# Patient Record
Sex: Male | Born: 1975 | Race: White | Hispanic: No | Marital: Married | Smoking: Never smoker
Health system: Southern US, Community
[De-identification: ages and names within clinical notes are randomized; demographics above are authoritative.]

## PROBLEM LIST (undated history)

## (undated) HISTORY — PX: ELBOW SURGERY: SHX618

---

## 2012-04-03 ENCOUNTER — Emergency Department (HOSPITAL_COMMUNITY)
Admission: EM | Admit: 2012-04-03 | Discharge: 2012-04-03 | Disposition: A | Discharge status: Home / Self Care | Payer: BC Managed Care – PPO | Attending: Emergency Medicine | Admitting: Emergency Medicine

## 2012-04-03 ENCOUNTER — Encounter (HOSPITAL_COMMUNITY): Payer: Self-pay | Admitting: *Deleted

## 2012-04-03 ENCOUNTER — Emergency Department (HOSPITAL_COMMUNITY): Payer: BC Managed Care – PPO

## 2012-04-03 DIAGNOSIS — N433 Hydrocele, unspecified: Secondary | ICD-10-CM | POA: Insufficient documentation

## 2012-04-03 DIAGNOSIS — N453 Epididymo-orchitis: Secondary | ICD-10-CM | POA: Insufficient documentation

## 2012-04-03 LAB — URINALYSIS, ROUTINE W REFLEX MICROSCOPIC
Hgb urine dipstick: NEGATIVE
Nitrite: NEGATIVE
Specific Gravity, Urine: 1.017 (ref 1.005–1.030)
Urobilinogen, UA: 0.2 mg/dL (ref 0.0–1.0)

## 2012-04-03 LAB — COMPREHENSIVE METABOLIC PANEL
ALT: 32 U/L (ref 0–53)
Alkaline Phosphatase: 81 U/L (ref 39–117)
CO2: 27 mEq/L (ref 19–32)
GFR calc Af Amer: >90 mL/min (ref >90)
GFR calc non Af Amer: >90 mL/min (ref >90)
Glucose, Bld: 105 mg/dL — ABNORMAL HIGH (ref 70–99)
Potassium: 4.3 mEq/L (ref 3.5–5.1)
Sodium: 139 mEq/L (ref 135–145)

## 2012-04-03 LAB — CBC WITH DIFFERENTIAL/PLATELET
Lymphocytes Relative: 23 % (ref 12–46)
Lymphs Abs: 3.3 10*3/uL (ref 0.7–4.0)
MCV: 88 fL (ref 78.0–100.0)
Neutro Abs: 9.6 10*3/uL — ABNORMAL HIGH (ref 1.7–7.7)
Neutrophils Relative %: 68 % (ref 43–77)
Platelets: 197 10*3/uL (ref 150–400)
RBC: 5.09 MIL/uL (ref 4.22–5.81)
WBC: 14.1 10*3/uL — ABNORMAL HIGH (ref 4.0–10.5)

## 2012-04-03 MED ORDER — LEVOFLOXACIN 750 MG PO TABS
750.0000 mg | ORAL_TABLET | Freq: Once | ORAL | Status: AC
  Administered 2012-04-03: 750 mg via ORAL
  Filled 2012-04-03: qty 1

## 2012-04-03 MED ORDER — OXYCODONE-ACETAMINOPHEN 5-325 MG PO TABS: 1.0000 | ORAL_TABLET | Freq: Four times a day (QID) | ORAL | Status: AC | PRN

## 2012-04-03 MED ORDER — LEVOFLOXACIN 250 MG PO TABS: 750.0000 mg | ORAL_TABLET | Freq: Every day | ORAL | Status: AC

## 2012-04-03 MED ORDER — IBUPROFEN 800 MG PO TABS: 800.0000 mg | ORAL_TABLET | Freq: Three times a day (TID) | ORAL | Status: AC

## 2012-04-03 NOTE — ED Notes (Signed)
Patient presents stating his testicle is swollen and does not remember any injury.  Denies blood in his urine, +fever.  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  Went to the gym Thursday evening and did some "boxing"  He wore a cup and does not remember getting hit.  Then he went to the gym Friday evening and just worked out.  Did not lift anything heavy.

## 2012-04-03 NOTE — ED Provider Notes (Signed)
History     CSN: [id number]  Arrival date & time 04/03/12  1947   First MD Initiated Contact with Patient 04/03/12 2042      Chief Complaint  Patient presents with  . swollen testicle     (Consider location/radiation/quality/duration/timing/severity/associated sxs/prior treatment) HPI Comments: Chad Larson presents with his wife for evaluation of left scrotal and testicular pain and swelling.  He reports that he felt fine yesterday and had no trauma to the testicle.  He awoke this morning to a mild or vague discomfort that has slowly increased in intensity and is now painful.  He has also noticed increased swelling.  He denies dysuria, high risk sexual behaviors, penile discharges, weakened urine flow, hematuria, pyuria, and abdominal pain.  He reports he has felt warm as if he were developing a fever.  Patient is a 36 y.o. male presenting with testicular pain. The history is provided by the patient. No language interpreter was used.  Testicle Pain This is a new problem. The current episode started 6 to 12 hours ago. The problem occurs constantly. The problem has been gradually worsening. Pertinent negatives include no chest pain, no abdominal pain, no headaches and no shortness of breath. The symptoms are aggravated by walking, bending, twisting and standing. The symptoms are relieved by rest (elevating the scrotum).    History reviewed. No pertinent past medical history.  History reviewed. No pertinent past surgical history.  No family history on file.  History  Substance Use Topics  . Smoking status: Never Smoker   . Smokeless tobacco: Not on file  . Alcohol Use: Yes      Review of Systems  Constitutional: Positive for fever. Negative for chills, diaphoresis, appetite change and fatigue.  HENT: Negative.   Eyes: Negative.   Respiratory: Negative.  Negative for shortness of breath.   Cardiovascular: Negative.  Negative for chest pain.  Gastrointestinal: Negative for  nausea, vomiting, abdominal pain, diarrhea, constipation and blood in stool.  Genitourinary: Positive for scrotal swelling and testicular pain. Negative for dysuria, urgency, frequency, hematuria, flank pain, decreased urine volume, discharge, penile swelling, difficulty urinating, genital sores and penile pain.  Musculoskeletal: Negative.   Skin: Negative.   Neurological: Negative for headaches.  Hematological: Negative.   Psychiatric/Behavioral: Negative.     Allergies  Review of patient's allergies indicates no known allergies.  Home Medications  No current outpatient prescriptions on file.  BP 134/82  Pulse 101  Temp 100.4 F (38 C) (Oral)  Resp 18  SpO2 100%  Physical Exam  Nursing note and vitals reviewed. Constitutional: He is oriented to person, place, and time. He appears well-developed and well-nourished. No distress.  HENT:  Head: Normocephalic and atraumatic.  Right Ear: External ear normal.  Left Ear: External ear normal.  Nose: Nose normal.  Mouth/Throat: Oropharynx is clear and moist. No oropharyngeal exudate.  Eyes: Conjunctivae normal and EOM are normal. Pupils are equal, round, and reactive to light. Right eye exhibits no discharge. Left eye exhibits no discharge. No scleral icterus.  Neck: Normal range of motion. Neck supple. No JVD present. No tracheal deviation present. No thyromegaly present.  Cardiovascular: Normal rate, regular rhythm, normal heart sounds and intact distal pulses.  Exam reveals no gallop and no friction rub.   No murmur heard. Pulmonary/Chest: Effort normal and breath sounds normal. No stridor. No respiratory distress. He has no wheezes. He has no rales. He exhibits no tenderness.  Abdominal: Soft. Bowel sounds are normal. He exhibits no distension and no mass.  There is no tenderness. There is no rebound and no guarding. Hernia confirmed negative in the right inguinal area and confirmed negative in the left inguinal area.  Genitourinary:  Penis normal. Right testis shows no mass, no swelling and no tenderness. Right testis is descended. Cremasteric reflex is not absent on the right side. Left testis shows mass, swelling and tenderness. Left testis is descended. Cremasteric reflex is absent on the left side. Circumcised. No phimosis, paraphimosis, hypospadias, penile erythema or penile tenderness. No discharge found.  Musculoskeletal: Normal range of motion. He exhibits no edema and no tenderness.  Lymphadenopathy:    He has no cervical adenopathy.       Right: No inguinal adenopathy present.       Left: No inguinal adenopathy present.  Neurological: He is alert and oriented to person, place, and time. No cranial nerve deficit.  Skin: Skin is warm and dry. No rash noted. He is not diaphoretic. No erythema. No pallor.  Psychiatric: He has a normal mood and affect. His behavior is normal.    ED Course  Procedures (including critical care time)  Labs Reviewed  CBC WITH DIFFERENTIAL - Abnormal; Notable for the following:    WBC 14.1 (*)     Neutro Abs 9.6 (*)     Monocytes Absolute 1.1 (*)     All other components within normal limits  COMPREHENSIVE METABOLIC PANEL - Abnormal; Notable for the following:    Glucose, Bld 105 (*)     AST 39 (*)     All other components within normal limits  URINALYSIS, ROUTINE W REFLEX MICROSCOPIC   No results found.   No diagnosis found.    MDM  Pt presents for evaluation of left testicular pain and swelling.  He has a significantly swollen left testicle with pain to palpation along the epididymus and what feels like a hydrocele.  No hernia appreciated on exam.  U/A and metabolic panel are nl, but there is a leukocytosis on CBC.  Pt refuses any pain medication.  U/S has been ordered.  Will review results as available.  2300.  Pt stable, NAD.  U/s demonstrates a left epididymitis/orchitis with a small hydrocele.  No masses or evidence of torsion noted.  Pt again denies high risk sexual  behaviors.  Plan treat with a 10 day course of levaquin.  Will provide follow-up information for a local urologist.  Discussed indications for immediate return to the emergency department.      Tobin Chad, MD 04/03/12 [phone]

## 2012-04-03 NOTE — ED Notes (Signed)
The pt has had a swelling in his lt testicle all day today with an elevated temp.  No  Urinary difficulty no bloody urine.  No previous history

## 2013-08-29 IMAGING — US US ART/VEN ABD/PELV/SCROTUM DOPPLER LTD
1 series · 13 of 25 positions shown · non-contrast
Comparison: None

CLINICAL DATA: 35-year-old male with left testicular pain and
swelling.

SCROTAL ULTRASOUND
DOPPLER ULTRASOUND OF THE TESTICLES
TECHNIQUE: Complete ultrasound examination of the testicles,
epididymis, and other scrotal structures was performed.  Color and
spectral Doppler ultrasound were also utilized to evaluate blood
flow to the testicles.

[Series 1: us art/ven abd/pelv/scrotum doppler ltd · 0.08mm/px · 13 of 55 slices shown]
[im 1/55]
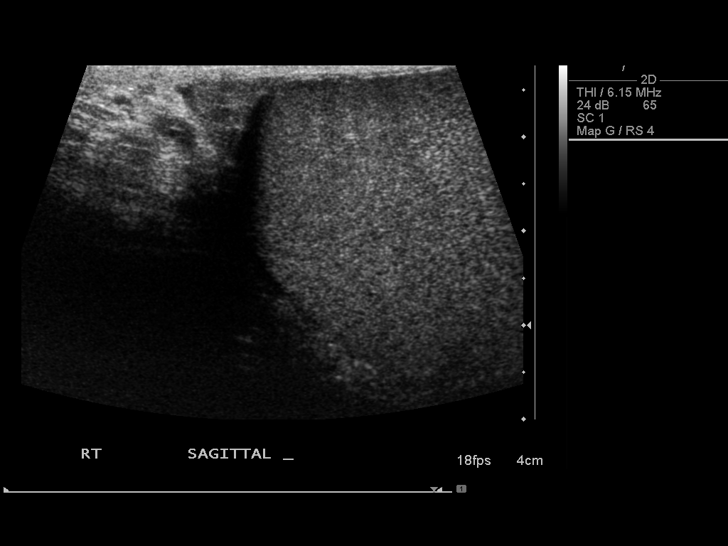
[im 5/55]
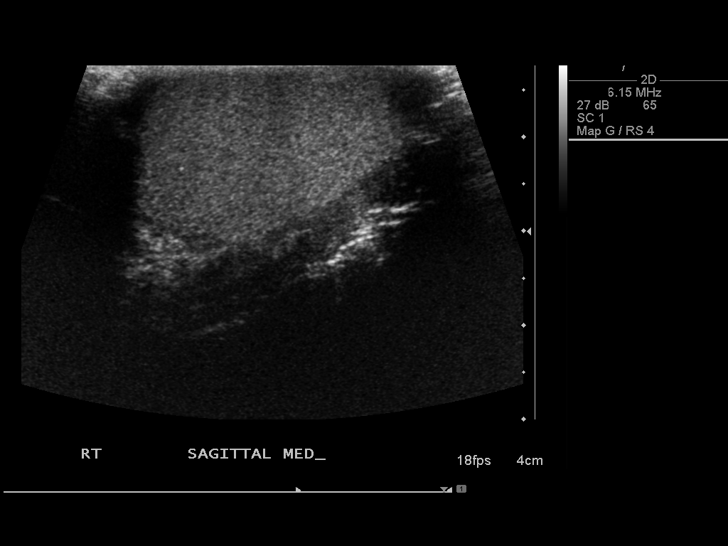
[im 10/55]
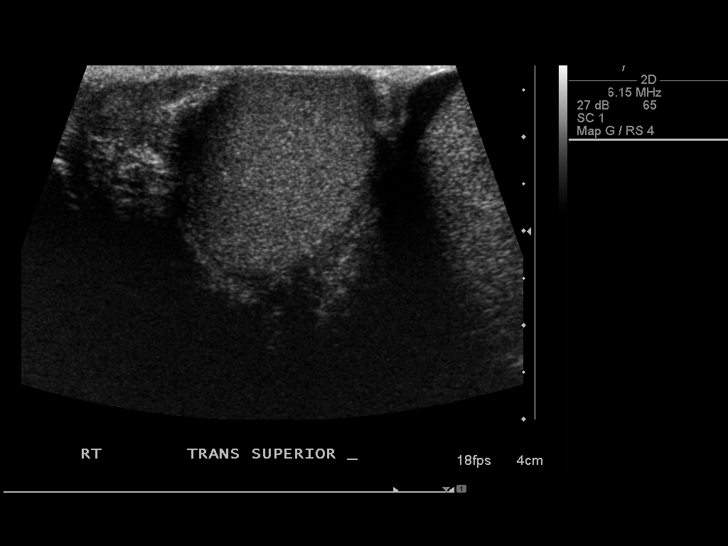
[im 14/55]
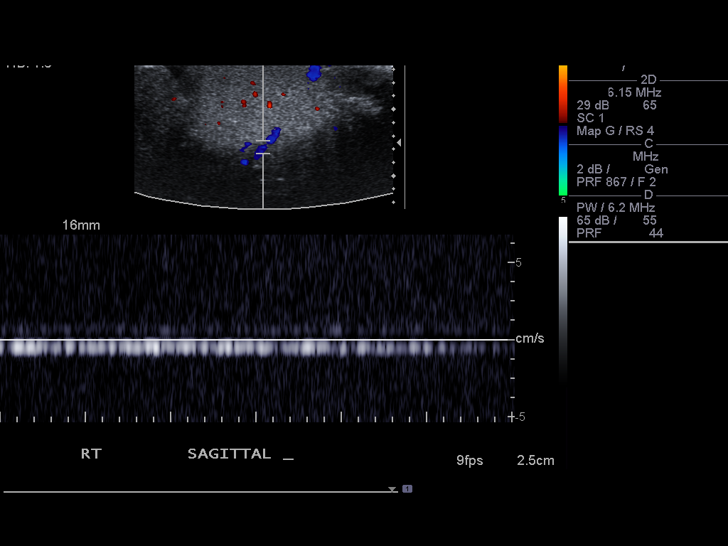
[im 19/55]
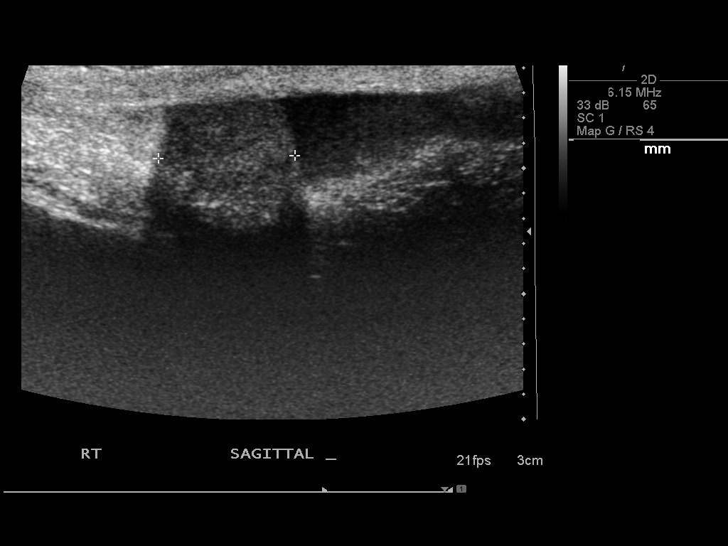
[im 23/55]
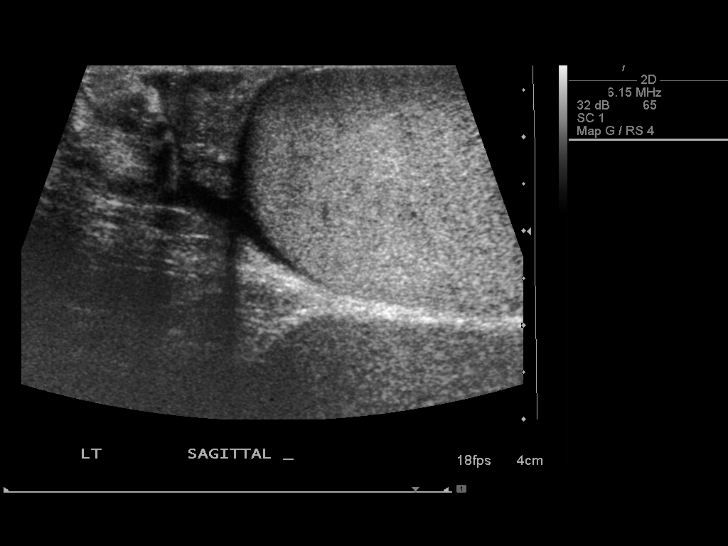
[im 28/55]
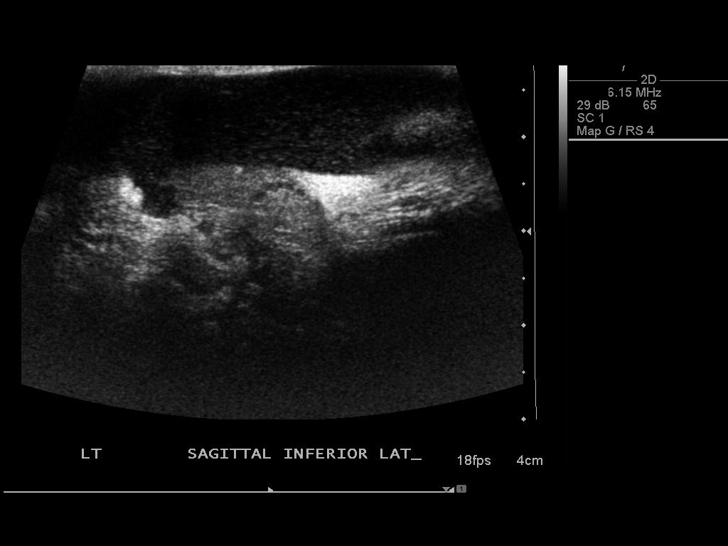
[im 32/55]
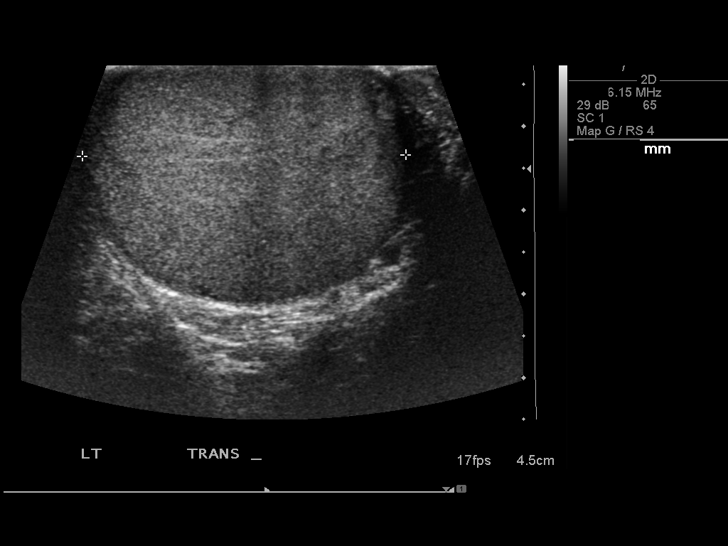
[im 37/55]
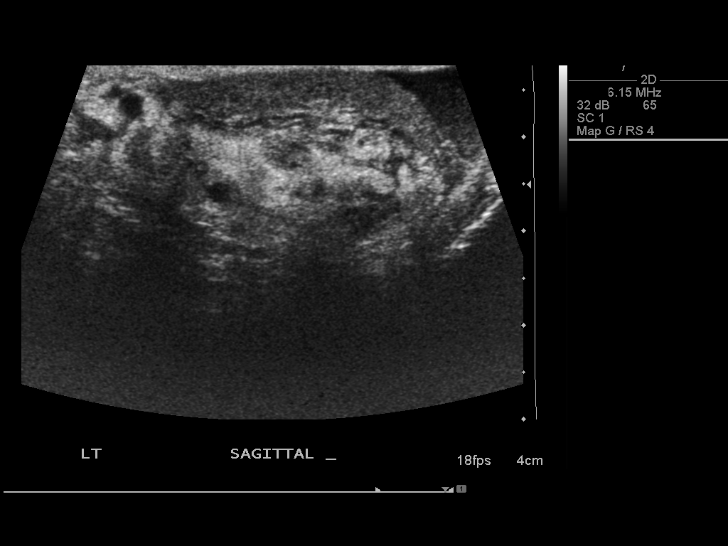
[im 41/55]
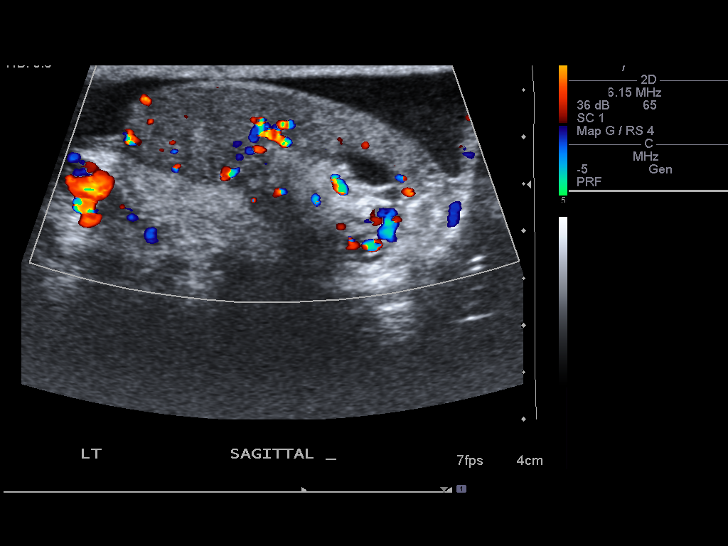
[im 46/55]
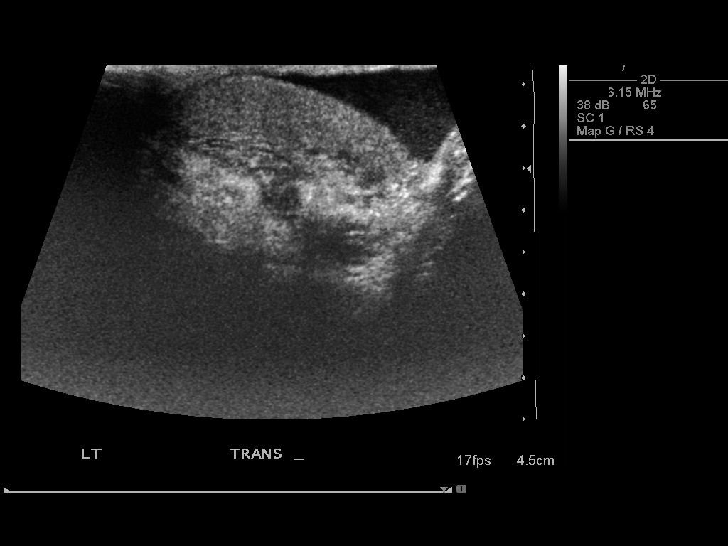
[im 50/55]
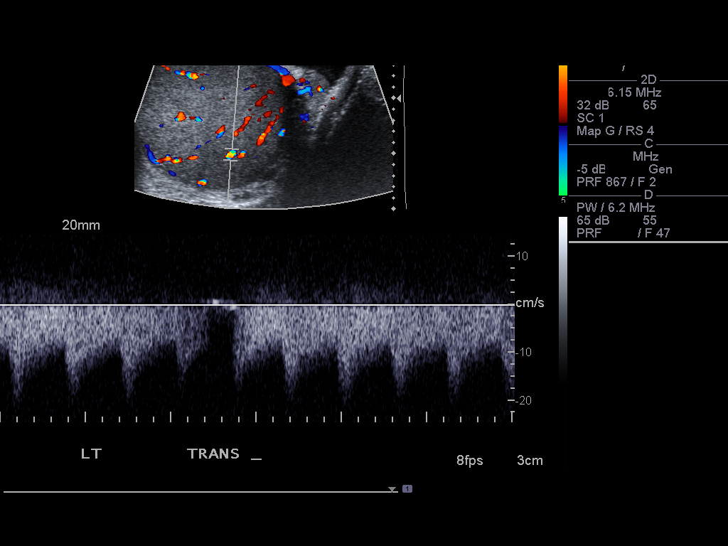
[im 55/55]
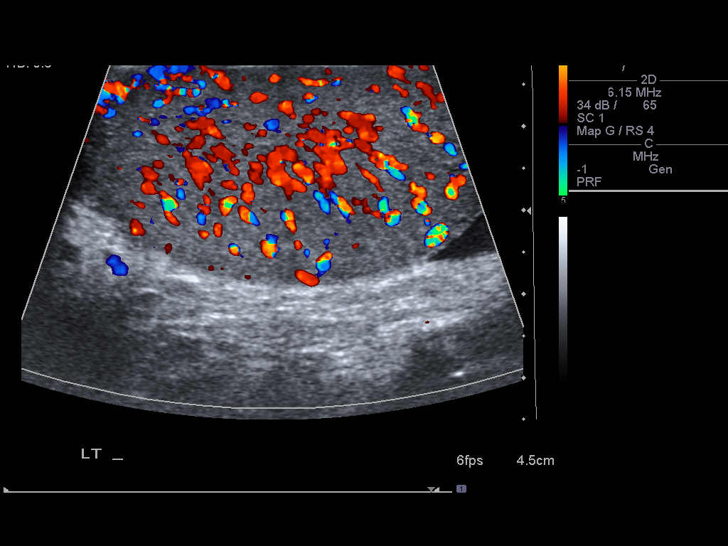

[13 of 25 positions shown; findings below may reference images not displayed]

FINDINGS: Right testis:  The right testicle is normal in echogenicity and
size.  Normal color Doppler flow and arterial/venous waveforms are
noted.  No focal testicular abnormalities are identified.

Left testis:  The left testicle is normal in echogenicity and size.
Increased color Doppler flow is noted.  No focal testicular
abnormalities are identified.

Right epididymis:  Normal in size and appearance.

Left epididymis:  The left epididymis is mildly enlarged with
increased flow.

Hydrocele:  A small mildly complex left hydrocele is noted.

Varicocele:  Absent

Pulsed Doppler interrogation of both testes demonstrates low
resistance flow bilaterally.
IMPRESSION: Left epididymo-orchitis with small complex left hydrocele.

No evidence of testicular torsion or focal testicular masses.

## 2015-08-14 ENCOUNTER — Other Ambulatory Visit: Payer: Self-pay | Admitting: Gastroenterology

## 2015-08-14 DIAGNOSIS — R1084 Generalized abdominal pain: Secondary | ICD-10-CM

## 2015-08-17 ENCOUNTER — Ambulatory Visit
Admission: RE | Admit: 2015-08-17 | Discharge: 2015-08-17 | Disposition: A | Discharge status: Home / Self Care | Payer: Commercial Managed Care - HMO | Source: Ambulatory Visit | Attending: Gastroenterology | Admitting: Gastroenterology

## 2015-08-17 DIAGNOSIS — R1084 Generalized abdominal pain: Secondary | ICD-10-CM

## 2015-08-17 MED ORDER — IOPAMIDOL (ISOVUE-300) INJECTION 61%
100.0000 mL | Freq: Once | INTRAVENOUS | Status: AC | PRN
  Administered 2015-08-17: 100 mL via INTRAVENOUS

## 2016-02-21 ENCOUNTER — Other Ambulatory Visit: Payer: Self-pay | Admitting: Gastroenterology

## 2016-02-21 DIAGNOSIS — R911 Solitary pulmonary nodule: Secondary | ICD-10-CM

## 2016-03-04 ENCOUNTER — Ambulatory Visit
Admission: RE | Admit: 2016-03-04 | Discharge: 2016-03-04 | Disposition: A | Discharge status: Home / Self Care | Payer: Commercial Managed Care - HMO | Source: Ambulatory Visit | Attending: Gastroenterology | Admitting: Gastroenterology

## 2016-03-04 DIAGNOSIS — R911 Solitary pulmonary nodule: Secondary | ICD-10-CM

## 2017-03-10 ENCOUNTER — Other Ambulatory Visit: Payer: Self-pay | Admitting: Gastroenterology

## 2017-03-10 DIAGNOSIS — R911 Solitary pulmonary nodule: Secondary | ICD-10-CM

## 2017-07-30 IMAGING — CT CT CHEST W/O CM
3 of 4 series · 16 of 30 positions shown, 18 images · non-contrast
Comparison: CT abdomen pelvis of 08/17/2015

CLINICAL DATA: History of lung nodule on CT abdomen pelvis

EXAM:
CT CHEST WITHOUT CONTRAST
TECHNIQUE: Multidetector CT imaging of the chest was performed following the
standard protocol without IV contrast.

[Series 3: chest w/o · axial · non-contrast · 0.74mm/px · z∈[-223,-11]mm · 7 of 115 slices shown, 9 images]
[im 15/115  mediastinal]
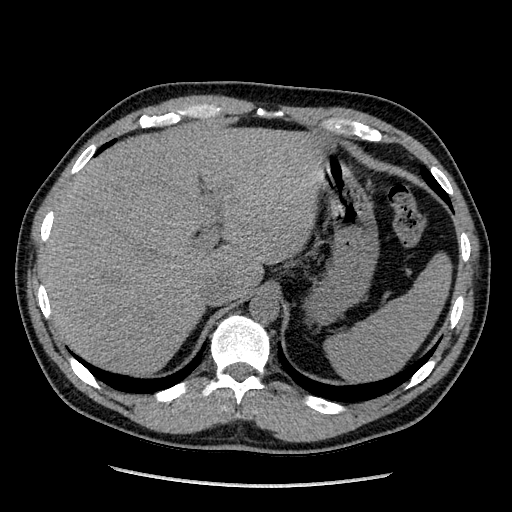
[im 15/115  lung]
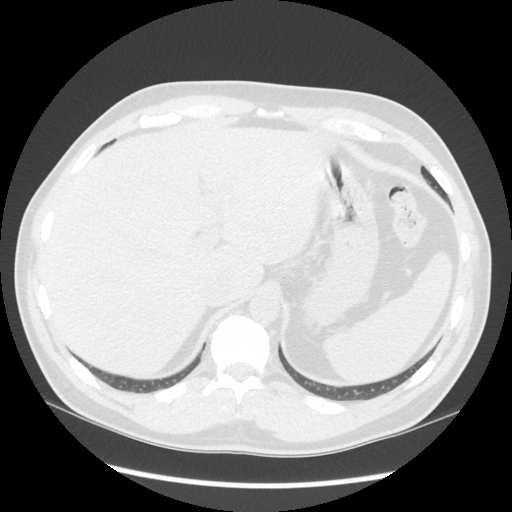
[im 29/115  lung]
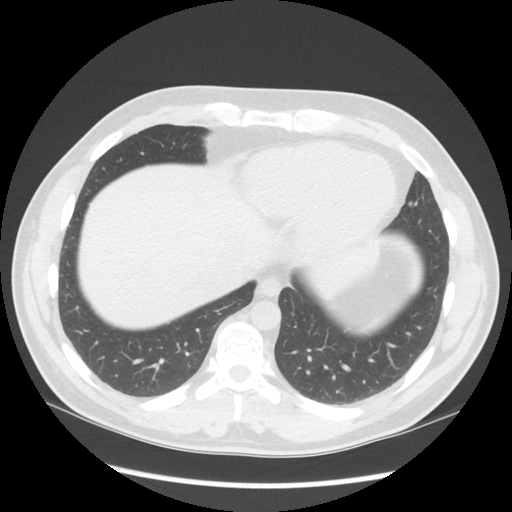
[im 43/115  lung]
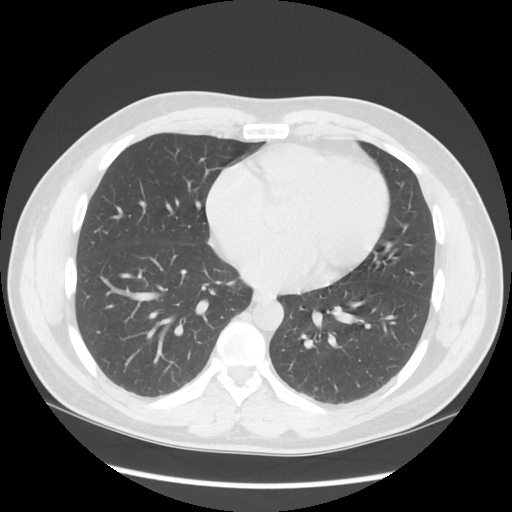
[im 58/115  lung]
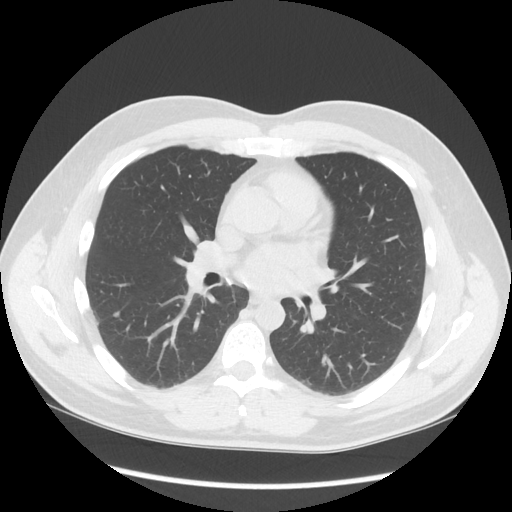
[im 72/115  mediastinal]
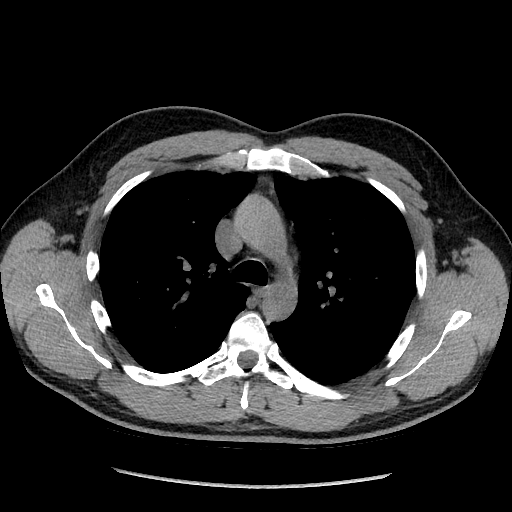
[im 72/115  lung]
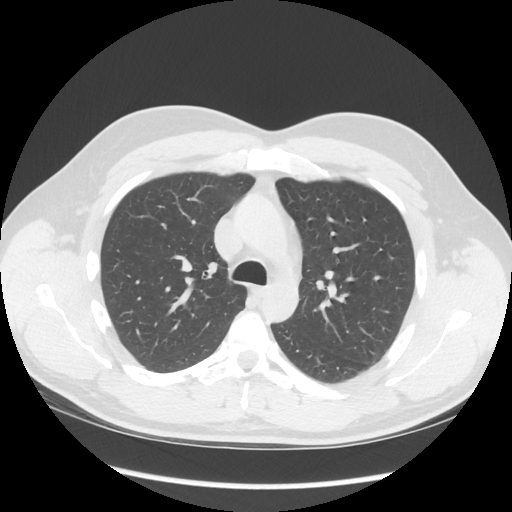
[im 86/115  lung]
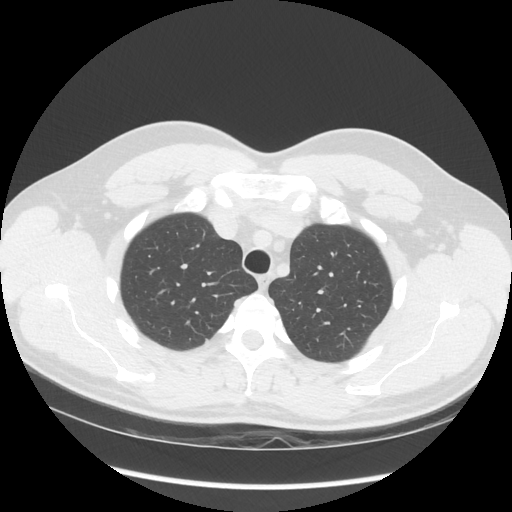
[im 100/115  lung]
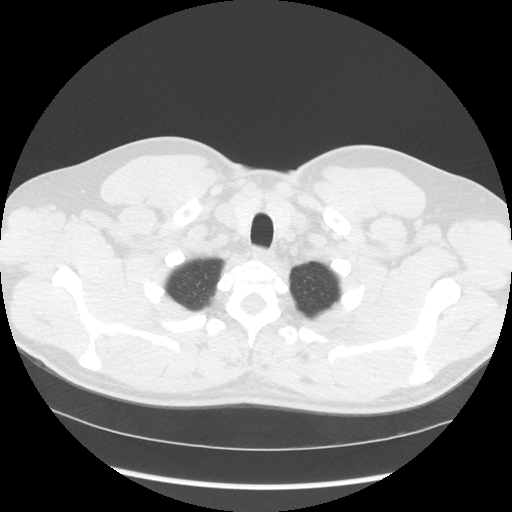

[Series 4: lung windows · axial · 0.74mm/px · z∈[-218,-13]mm · 6 of 116 slices shown]
[im 17/116  lung]
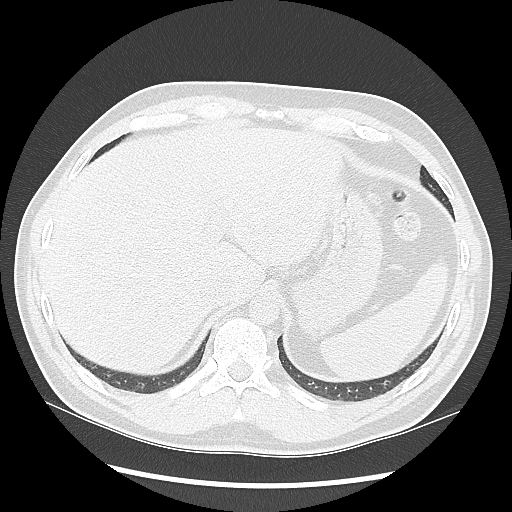
[im 33/116  lung]
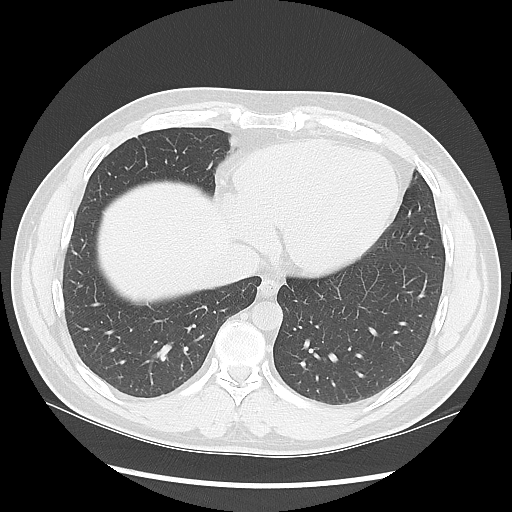
[im 50/116  lung]
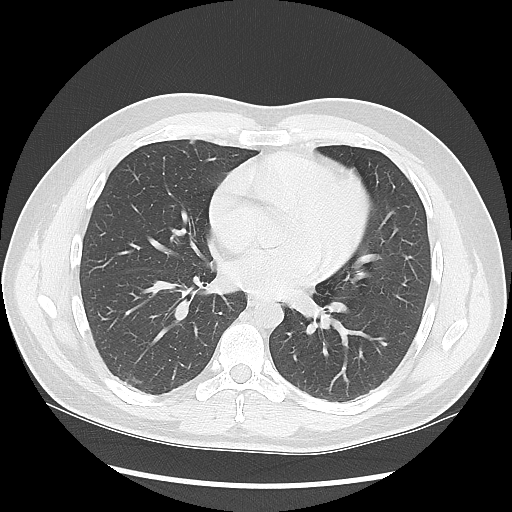
[im 66/116  lung]
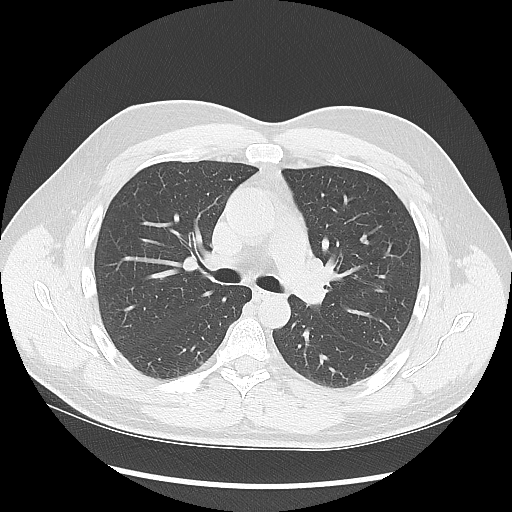
[im 83/116  lung]
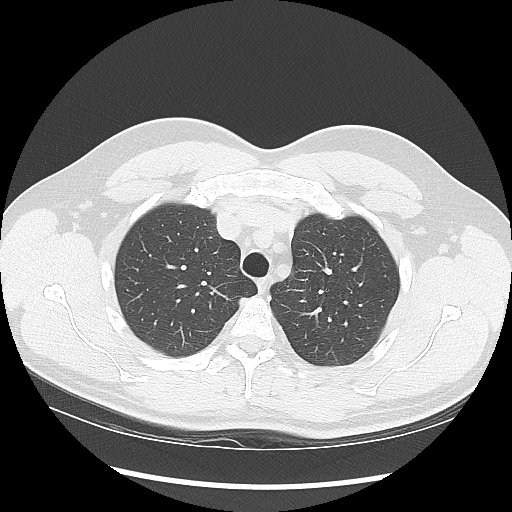
[im 99/116  lung]
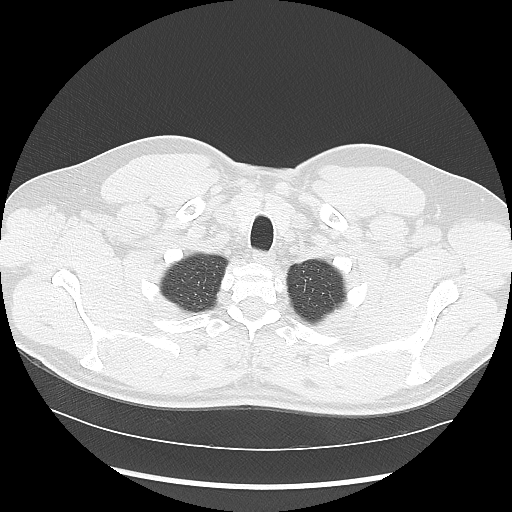

[Series 602: sagittal body · sagittal · 0.74mm/px · 3 of 153 slices shown]
[im 16/153  mediastinal]
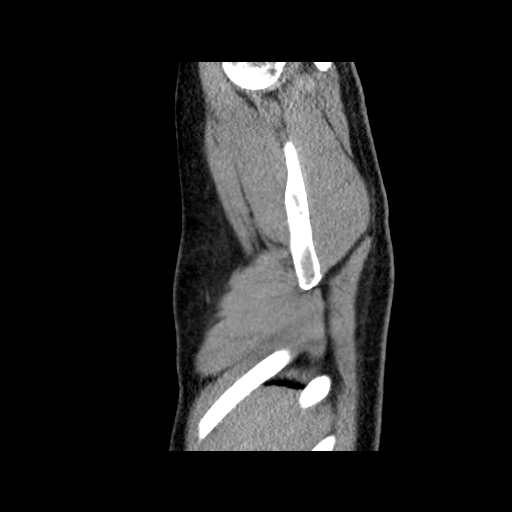
[im 31/153  mediastinal]
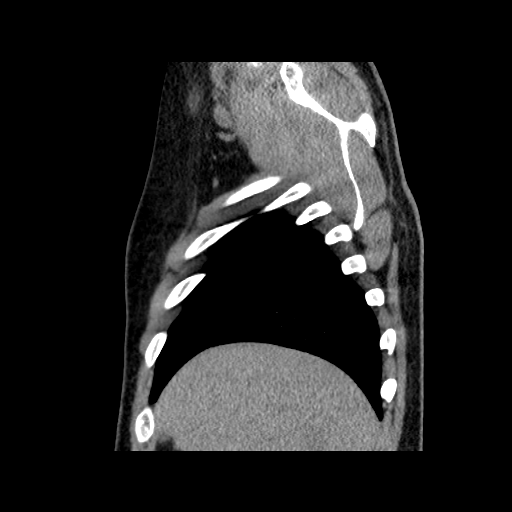
[im 46/153  mediastinal]
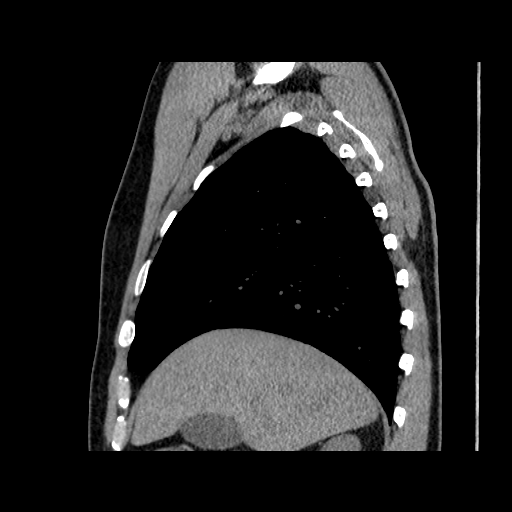

[16 of 30 positions shown; findings below may reference images not displayed]

FINDINGS: Cardiovascular: The mid ascending thoracic aorta measures 36 mm in
diameter. The heart is within normal limits in size. No pericardial
effusion is seen.

Mediastinum/Nodes: On this unenhanced study, no mediastinal or hilar
adenopathy is seen. No abnormality of the esophagus is noted.

Lungs/Pleura: A prior CT the abdomen pelvis a 5 mm noncalcified
nodule is noted subpleural in location within the right lower lobe.
That nodule is again noted, measuring 4 mm in diameter currently. In
addition, there is a 5 mm noncalcified nodule subpleural within the
right upper lobe on image 65 series 4 appear and irregular 5 mm
nodule is noted along the fissure within the right upper lobe, but
not typical of perifissural lymph node on the sagittal images. A 3
mm nodule is noted within the subpleural left upper lobe on image
32. No additional lung nodule is seen. These nodules are not
calcified and do not have suspicious features. However, in view of
the multiple nodules as well as the nodule measuring up to 5 mm in
diameter in the right lower lobe, the cul falling followup
recommendations are made. No follow-up needed if patient is low-risk
(and has no known or suspected primary neoplasm). Non-contrast chest
CT can be considered in 12 months if patient is high-risk. This
recommendation follows the consensus statement: Guidelines for
Management of Incidental Pulmonary Nodules Detected on CT
Images:From the [HOSPITAL] 6303; published online before
print (10.1148/radiol.[id number]). No parenchymal infiltrate is
seen. There is no evidence of pleural effusion. The

Upper Abdomen: What is seen of the upper abdomen is unremarkable.

Musculoskeletal: The thoracic vertebrae are in normal alignment with
normal intervertebral disc spaces.
IMPRESSION: 1. There are several noncalcified lung nodules more on the right
than the left. These most likely are benign in etiology, but
followup recommendations are given above.
2. No mediastinal or hilar adenopathy.

## 2019-08-01 ENCOUNTER — Other Ambulatory Visit: Payer: Self-pay

## 2019-08-01 ENCOUNTER — Emergency Department (INDEPENDENT_AMBULATORY_CARE_PROVIDER_SITE_OTHER)
Admission: EM | Admit: 2019-08-01 | Discharge: 2019-08-01 | Disposition: A | Discharge status: Home / Self Care | Payer: Managed Care, Other (non HMO) | Source: Home / Self Care | Attending: Family Medicine | Admitting: Family Medicine

## 2019-08-01 DIAGNOSIS — R030 Elevated blood-pressure reading, without diagnosis of hypertension: Secondary | ICD-10-CM | POA: Diagnosis not present

## 2019-08-01 DIAGNOSIS — R079 Chest pain, unspecified: Secondary | ICD-10-CM

## 2019-08-01 DIAGNOSIS — K5909 Other constipation: Secondary | ICD-10-CM | POA: Diagnosis not present

## 2019-08-01 LAB — POCT CBC W AUTO DIFF (K'VILLE URGENT CARE)

## 2019-08-01 LAB — POCT URINALYSIS DIP (MANUAL ENTRY)
Bilirubin, UA: NEGATIVE
Glucose, UA: NEGATIVE mg/dL
Ketones, POC UA: NEGATIVE mg/dL
Leukocytes, UA: NEGATIVE
Nitrite, UA: NEGATIVE
Protein Ur, POC: NEGATIVE mg/dL
Spec Grav, UA: 1.025 (ref 1.010–1.025)
Urobilinogen, UA: 0.2 E.U./dL
pH, UA: 7 (ref 5.0–8.0)

## 2019-08-01 NOTE — ED Provider Notes (Signed)
[facility]    CSN: [id number] Arrival date & time: 08/01/19  1608      History   Chief Complaint Chief Complaint  Patient presents with  . Hypertension    HPI Chad Larson is a 44 y.o. male.   Patient presents with a variety of complaints.  He states that his BP has been intermittently elevated during the past two weeks, with systolic pressure as high as 180 on several occasions.  He has had intermittent vague tightness in his chest radiating to his left scapula.  He has felt jittery at times, "shaking inside." About 2 weeks ago he felt constipated and began taking Miralax with improvement.  Last weekend he started a juice "cleansing" diet.  He reports that three toes on his left foot sometimes feel numb and cold, and he sometimes has an intermittent cramp in the back of his legs.  The history is provided by the patient.    History reviewed. No pertinent past medical history.  There are no problems to display for this patient.   Past Surgical History:  Procedure Laterality Date  . ELBOW SURGERY         Home Medications    Prior to Admission medications   Not on File    Family History History reviewed. No pertinent family history.  Social History Social History   Tobacco Use  . Smoking status: Never Smoker  Substance Use Topics  . Alcohol use: Yes  . Drug use: Not on file     Allergies   Patient has no known allergies.   Review of Systems Review of Systems  Constitutional: Negative for activity change, appetite change, chills, diaphoresis, fatigue, fever and unexpected weight change.  HENT: Negative.   Eyes: Negative.   Respiratory: Negative.   Cardiovascular: Positive for chest pain. Negative for palpitations and leg swelling.  Gastrointestinal: Positive for constipation.  Genitourinary: Negative.   Musculoskeletal:       Occasional cramps in lower legs  Skin: Negative.   Neurological:       Occasional paresthesias left foot.      Physical Exam Triage Vital Signs ED Triage Vitals  Enc Vitals Group     BP 08/01/19 1744 (!) 147/89     Pulse Rate 08/01/19 1744 80     Resp 08/01/19 1744 20     Temp 08/01/19 1744 98.4 F (36.9 C)     Temp Source 08/01/19 1744 Oral     SpO2 08/01/19 1744 99 %     Weight 08/01/19 1745 201 lb (91.2 kg)     Height 08/01/19 1745 5\' 11"  (1.803 m)     Head Circumference --      Peak Flow --      Pain Score 08/01/19 1747 0     Pain Loc --      Pain Edu? --      Excl. in [place]? --    No data found.  Updated Vital Signs BP (!) 147/89 (BP Location: Right Arm)   Pulse 80   Temp 98.4 F (36.9 C) (Oral)   Resp 20   Ht 5\' 11"  (1.803 m)   Wt 91.2 kg   SpO2 99%   BMI 28.03 kg/m   Visual Acuity Right Eye Distance:   Left Eye Distance:   Bilateral Distance:    Right Eye Near:   Left Eye Near:    Bilateral Near:     Physical Exam Nursing notes and Vital Signs reviewed. Appearance:  Patient  appears stated age, and in no acute distress Eyes:  Pupils are equal, round, and reactive to light and accomodation.  Extraocular movement is intact.  Conjunctivae are not inflamed  Ears:  Canals normal.  Tympanic membranes normal.  Nose:   Normal turbinates.  No sinus tenderness.  Pharynx:  Normal Neck:  Supple. No adenopathy or thyromegaly.  Lungs:  Clear to auscultation.  Breath sounds are equal.  Moving air well. Heart:  Regular rate and rhythm without murmurs, rubs, or gallops.  Abdomen:  Nontender without masses or hepatosplenomegaly.  Bowel sounds are present.  No CVA or flank tenderness.  Extremities:  No edema. No lower leg tenderness.  Skin:  No rash present.   UC Treatments / Results  Labs (all labs ordered are listed, but only abnormal results are displayed) Labs Reviewed  POCT URINALYSIS DIP (MANUAL ENTRY) - Abnormal; Notable for the following components:      Result Value   Blood, UA trace-lysed (*)    All other components within normal limits  COMPLETE METABOLIC  PANEL WITH GFR  TSH  T4, FREE  POCT CBC W AUTO DIFF ([facility]):  WBC 8.5; LY 17.2; MO 5.4; GR 77.4; Hgb 15.6; Platelets 231     EKG  Rate:  87 BPM PR:  142 msec QT:  344 msec QTcH:  413 msec QRSD:  76 msec QRS axis:  44 degrees Interpretation:  within normal limits; normal sinus rhythm    Radiology No results found.  Procedures Procedures (including critical care time)  Medications Ordered in UC Medications - No data to display  Initial Impression / Assessment and Plan / UC Course  I have reviewed the triage vital signs and the nursing notes.  Pertinent labs & imaging results that were available during my care of the patient were reviewed by me and considered in my medical decision making (see chart for details).    Normal EKG and CBC reassuring. TSH, free T4, and CMP pending. Followup with Family Doctor.   Final Clinical Impressions(s) / UC Diagnoses   Final diagnoses:  Blood pressure elevated without history of HTN  Chest pain, unspecified type  Other constipation     Discharge Instructions     Please monitor your blood pressure several times weekly, at different times of the day, record on a calendar with times of measurement and take this record to your Family Doctor.     ED Prescriptions    None        Lattie Haw, MD 08/03/19 1912

## 2019-08-01 NOTE — ED Triage Notes (Signed)
The last couple of days, pt's BP has been elevated.  Was 180's over 90's earlier today.   Has had sx of jitteriness, Felt like he was shaking inside, Tightness in chest, pain in left shoulder blade.  This has been going on for about a week or so.  Around Christmas starting taking miralax.  Has become more regular.  Juice cleanse diet last weekend.  Took 325 mg aspirin today.  Left 3 toes are numb and cold off and on.  Sometimes has a cramp in the back of his legs.

## 2019-08-01 NOTE — Discharge Instructions (Addendum)
Please monitor your blood pressure several times weekly, at different times of the day, record on a calendar with times of measurement and take this record to your Family Doctor.  

## 2019-08-02 LAB — COMPLETE METABOLIC PANEL WITH GFR
AG Ratio: 1.9 (calc) (ref 1.0–2.5)
ALT: 20 U/L (ref 9–46)
AST: 21 U/L (ref 10–40)
Albumin: 5 g/dL (ref 3.6–5.1)
Alkaline phosphatase (APISO): 61 U/L (ref 36–130)
BUN: 12 mg/dL (ref 7–25)
CO2: 24 mmol/L (ref 20–32)
Calcium: 9.6 mg/dL (ref 8.6–10.3)
Chloride: 102 mmol/L (ref 98–110)
Creat: 1.09 mg/dL (ref 0.60–1.35)
GFR, Est African American: 96 mL/min/{1.73_m2} (ref >=60)
GFR, Est Non African American: 83 mL/min/{1.73_m2} (ref >=60)
Globulin: 2.6 g/dL (calc) (ref 1.9–3.7)
Glucose, Bld: 92 mg/dL (ref 65–99)
Potassium: 4.2 mmol/L (ref 3.5–5.3)
Sodium: 138 mmol/L (ref 135–146)
Total Bilirubin: 0.5 mg/dL (ref 0.2–1.2)
Total Protein: 7.6 g/dL (ref 6.1–8.1)

## 2019-08-02 LAB — T4, FREE: Free T4: 1.3 ng/dL (ref 0.8–1.8)

## 2019-08-02 LAB — TSH: TSH: 0.56 mIU/L (ref 0.40–4.50)

## 2019-08-03 ENCOUNTER — Telehealth: Payer: Self-pay | Admitting: Emergency Medicine

## 2019-08-03 NOTE — Telephone Encounter (Signed)
Patient called for labs, everything came back normal.

## 2022-06-21 ENCOUNTER — Other Ambulatory Visit: Payer: Self-pay

## 2022-06-21 ENCOUNTER — Ambulatory Visit
Admission: EM | Admit: 2022-06-21 | Discharge: 2022-06-21 | Disposition: A | Discharge status: Home / Self Care | Payer: 59 | Attending: Family Medicine | Admitting: Family Medicine

## 2022-06-21 DIAGNOSIS — R509 Fever, unspecified: Secondary | ICD-10-CM | POA: Diagnosis not present

## 2022-06-21 DIAGNOSIS — Z1152 Encounter for screening for COVID-19: Secondary | ICD-10-CM | POA: Insufficient documentation

## 2022-06-21 DIAGNOSIS — K122 Cellulitis and abscess of mouth: Secondary | ICD-10-CM | POA: Diagnosis not present

## 2022-06-21 DIAGNOSIS — J111 Influenza due to unidentified influenza virus with other respiratory manifestations: Secondary | ICD-10-CM

## 2022-06-21 DIAGNOSIS — R059 Cough, unspecified: Secondary | ICD-10-CM

## 2022-06-21 LAB — RESP PANEL BY RT-PCR (FLU A&B, COVID) ARPGX2
Influenza A by PCR: NEGATIVE
Influenza B by PCR: NEGATIVE
SARS Coronavirus 2 by RT PCR: NEGATIVE

## 2022-06-21 MED ORDER — BENZONATATE 200 MG PO CAPS: 200.0000 mg | ORAL_CAPSULE | Freq: Three times a day (TID) | ORAL | 0 refills | Status: AC | PRN

## 2022-06-21 MED ORDER — AZITHROMYCIN 250 MG PO TABS: 250.0000 mg | ORAL_TABLET | Freq: Every day | ORAL | 0 refills | Status: AC

## 2022-06-21 NOTE — ED Triage Notes (Signed)
Fever, chills, body aches and cough onset since Tuesday

## 2022-06-21 NOTE — ED Provider Notes (Signed)
[facility]    CSN: [id number] Arrival date & time: 06/21/22  0919      History   Chief Complaint Chief Complaint  Patient presents with   Fever    HPI Chad Larson is a 46 y.o. male.   HPI 46 year old male presents with fever, chills, body aches, and cough for 4 days.  History reviewed. No pertinent past medical history.  There are no problems to display for this patient.   Past Surgical History:  Procedure Laterality Date   ELBOW SURGERY         Home Medications    Prior to Admission medications   Medication Sig Start Date End Date Taking? Authorizing Provider  azithromycin (ZITHROMAX) 250 MG tablet Take 1 tablet (250 mg total) by mouth daily. Take first 2 tablets together, then 1 every day until finished. 06/21/22  Yes Trevor Iha, FNP  benzonatate (TESSALON) 200 MG capsule Take 1 capsule (200 mg total) by mouth 3 (three) times daily as needed for up to 7 days. 06/21/22 06/28/22 Yes Trevor Iha, FNP    Family History History reviewed. No pertinent family history.  Social History Social History   Tobacco Use   Smoking status: Never  Substance Use Topics   Alcohol use: Yes     Allergies   Patient has no known allergies.   Review of Systems Review of Systems  Constitutional:  Positive for chills and fever.  Respiratory:  Positive for cough.   Musculoskeletal:  Positive for myalgias.  All other systems reviewed and are negative.    Physical Exam Triage Vital Signs ED Triage Vitals  Enc Vitals Group     BP 06/21/22 0941 (!) 142/85     Pulse Rate 06/21/22 0941 88     Resp 06/21/22 0941 16     Temp 06/21/22 0941 99 F (37.2 C)     Temp Source 06/21/22 0941 Oral     SpO2 06/21/22 0941 96 %     Weight 06/21/22 0943 200 lb (90.7 kg)     Height --      Head Circumference --      Peak Flow --      Pain Score 06/21/22 0943 2     Pain Loc --      Pain Edu? --      Excl. in [place]? --    No data found.  Updated Vital Signs BP  (!) 142/85 (BP Location: Left Arm)   Pulse 88   Temp 99 F (37.2 C) (Oral)   Resp 16   Wt 200 lb (90.7 kg)   SpO2 96%   BMI 27.89 kg/m       Physical Exam Vitals reviewed.  Constitutional:      Appearance: Normal appearance. He is obese. He is ill-appearing.  HENT:     Head: Normocephalic and atraumatic.     Right Ear: Tympanic membrane, ear canal and external ear normal.     Left Ear: Tympanic membrane, ear canal and external ear normal.     Mouth/Throat:     Mouth: Mucous membranes are moist.     Pharynx: Oropharynx is clear.  Eyes:     Extraocular Movements: Extraocular movements intact.     Conjunctiva/sclera: Conjunctivae normal.     Pupils: Pupils are equal, round, and reactive to light.  Cardiovascular:     Rate and Rhythm: Normal rate and regular rhythm.     Pulses: Normal pulses.     Heart sounds: Normal heart sounds.  Pulmonary:     Effort: Pulmonary effort is normal.     Breath sounds: Normal breath sounds. No wheezing, rhonchi or rales.  Musculoskeletal:        General: Normal range of motion.     Cervical back: Normal range of motion and neck supple.  Skin:    General: Skin is warm and dry.  Neurological:     General: No focal deficit present.     Mental Status: He is alert and oriented to person, place, and time.      UC Treatments / Results  Labs (all labs ordered are listed, but only abnormal results are displayed) Labs Reviewed  RESP PANEL BY RT-PCR (FLU A&B, COVID) [id number]    EKG   Radiology No results found.  Procedures Procedures (including critical care time)  Medications Ordered in UC Medications - No data to display  Initial Impression / Assessment and Plan / UC Course  I have reviewed the triage vital signs and the nursing notes.  Pertinent labs & imaging results that were available during my care of the patient were reviewed by me and considered in my medical decision making (see chart for details).     MDM: 1.  Uvulitis-Rx'd Zithromax; 2.  Influenza-like illness-lab 36144 ordered; 3.  Cough-Rx'd Tessalon.  Advised patient to take medications as directed with food to completion.  Advised may take Tessalon daily or as needed for cough.  Encouraged patient increase daily water intake to 64 ounces per day.  Advised we will follow-up with COVID-19/influenza results once received.  Advised if symptoms worsen and/or unresolved please follow-up with PCP or here for further evaluation. Final Clinical Impressions(s) / UC Diagnoses   Final diagnoses:  Influenza-like illness  Uvulitis  Cough, unspecified type     Discharge Instructions      Advised patient to take medications as directed with food to completion.  Advised may take Tessalon daily or as needed for cough.  Encouraged patient increase daily water intake to 64 ounces per day.  Advised we will follow-up with COVID-19/influenza results once received.  Advised if symptoms worsen and/or unresolved please follow-up with PCP or here for further evaluation.     ED Prescriptions     Medication Sig Dispense Auth. Provider   azithromycin (ZITHROMAX) 250 MG tablet Take 1 tablet (250 mg total) by mouth daily. Take first 2 tablets together, then 1 every day until finished. 6 tablet Trevor Iha, FNP   benzonatate (TESSALON) 200 MG capsule Take 1 capsule (200 mg total) by mouth 3 (three) times daily as needed for up to 7 days. 40 capsule Trevor Iha, FNP      PDMP not reviewed this encounter.   Trevor Iha, FNP 06/21/22 1012

## 2022-06-21 NOTE — Discharge Instructions (Addendum)
Advised patient to take medications as directed with food to completion.  Advised may take Tessalon daily or as needed for cough.  Encouraged patient increase daily water intake to 64 ounces per day.  Advised we will follow-up with COVID-19/influenza results once received.  Advised if symptoms worsen and/or unresolved please follow-up with PCP or here for further evaluation.

## 2022-06-22 ENCOUNTER — Telehealth: Payer: Self-pay

## 2022-06-22 NOTE — Telephone Encounter (Signed)
TC to f/u with pt after yesterday's visit to [facility]. Pt inquiring about COVID/flu test results. Informed both were negative. He has no further problems or questions to address at this time.
